# Patient Record
Sex: Female | Born: 1990 | Hispanic: No | Smoking: Never smoker
Health system: Southern US, Community
[De-identification: ages and names within clinical notes are randomized; demographics above are authoritative.]

## PROBLEM LIST (undated history)

## (undated) DIAGNOSIS — J45909 Unspecified asthma, uncomplicated: Secondary | ICD-10-CM

## (undated) HISTORY — PX: NO PAST SURGERIES: SHX2092

## (undated) HISTORY — DX: Unspecified asthma, uncomplicated: J45.909

---

## 2015-08-21 ENCOUNTER — Encounter: Payer: Self-pay | Admitting: Family Medicine

## 2015-08-21 ENCOUNTER — Ambulatory Visit (INDEPENDENT_AMBULATORY_CARE_PROVIDER_SITE_OTHER): Payer: BLUE CROSS/BLUE SHIELD | Admitting: Family Medicine

## 2015-08-21 VITALS — BP 122/78 | HR 79 | Temp 98.2°F | Ht 68.5 in | Wt 152.2 lb

## 2015-08-21 DIAGNOSIS — Z0000 Encounter for general adult medical examination without abnormal findings: Secondary | ICD-10-CM | POA: Diagnosis not present

## 2015-08-21 DIAGNOSIS — Z111 Encounter for screening for respiratory tuberculosis: Secondary | ICD-10-CM | POA: Diagnosis not present

## 2015-08-21 NOTE — Patient Instructions (Signed)
Your annual exam was normal.  Please follow up annually.  Return in 2 days for your TB test read.  Take care  Dr. Lacinda Axon  Health Maintenance, Female Adopting a healthy lifestyle and getting preventive care can go a long way to promote health and wellness. Talk with your health care provider about what schedule of regular examinations is right for you. This is a good chance for you to check in with your provider about disease prevention and staying healthy. In between checkups, there are plenty of things you can do on your own. Experts have done a lot of research about which lifestyle changes and preventive measures are most likely to keep you healthy. Ask your health care provider for more information. WEIGHT AND DIET  Eat a healthy diet  Be sure to include plenty of vegetables, fruits, low-fat dairy products, and lean protein.  Do not eat a lot of foods high in solid fats, added sugars, or salt.  Get regular exercise. This is one of the most important things you can do for your health.  Most adults should exercise for at least 150 minutes each week. The exercise should increase your heart rate and make you sweat (moderate-intensity exercise).  Most adults should also do strengthening exercises at least twice a week. This is in addition to the moderate-intensity exercise.  Maintain a healthy weight  Body mass index (BMI) is a measurement that can be used to identify possible weight problems. It estimates body fat based on height and weight. Your health care provider can help determine your BMI and help you achieve or maintain a healthy weight.  For females 23 years of age and older:   A BMI below 18.5 is considered underweight.  A BMI of 18.5 to 24.9 is normal.  A BMI of 25 to 29.9 is considered overweight.  A BMI of 30 and above is considered obese.  Watch levels of cholesterol and blood lipids  You should start having your blood tested for lipids and cholesterol at 25 years  of age, then have this test every 5 years.  You may need to have your cholesterol levels checked more often if:  Your lipid or cholesterol levels are high.  You are older than 25 years of age.  You are at high risk for heart disease.  CANCER SCREENING   Lung Cancer  Lung cancer screening is recommended for adults 33-19 years old who are at high risk for lung cancer because of a history of smoking.  A yearly low-dose CT scan of the lungs is recommended for people who:  Currently smoke.  Have quit within the past 15 years.  Have at least a 30-pack-year history of smoking. A pack year is smoking an average of one pack of cigarettes a day for 1 year.  Yearly screening should continue until it has been 15 years since you quit.  Yearly screening should stop if you develop a health problem that would prevent you from having lung cancer treatment.  Breast Cancer  Practice breast self-awareness. This means understanding how your breasts normally appear and feel.  It also means doing regular breast self-exams. Let your health care provider know about any changes, no matter how small.  If you are in your 20s or 30s, you should have a clinical breast exam (CBE) by a health care provider every 1-3 years as part of a regular health exam.  If you are 45 or older, have a CBE every year. Also consider having a  breast X-ray (mammogram) every year.  If you have a family history of breast cancer, talk to your health care provider about genetic screening.  If you are at high risk for breast cancer, talk to your health care provider about having an MRI and a mammogram every year.  Breast cancer gene (BRCA) assessment is recommended for women who have family members with BRCA-related cancers. BRCA-related cancers include:  Breast.  Ovarian.  Tubal.  Peritoneal cancers.  Results of the assessment will determine the need for genetic counseling and BRCA1 and BRCA2 testing. Cervical  Cancer Your health care provider may recommend that you be screened regularly for cancer of the pelvic organs (ovaries, uterus, and vagina). This screening involves a pelvic examination, including checking for microscopic changes to the surface of your cervix (Pap test). You may be encouraged to have this screening done every 3 years, beginning at age 63.  For women ages 28-65, health care providers may recommend pelvic exams and Pap testing every 3 years, or they may recommend the Pap and pelvic exam, combined with testing for human papilloma virus (HPV), every 5 years. Some types of HPV increase your risk of cervical cancer. Testing for HPV may also be done on women of any age with unclear Pap test results.  Other health care providers may not recommend any screening for nonpregnant women who are considered low risk for pelvic cancer and who do not have symptoms. Ask your health care provider if a screening pelvic exam is right for you.  If you have had past treatment for cervical cancer or a condition that could lead to cancer, you need Pap tests and screening for cancer for at least 20 years after your treatment. If Pap tests have been discontinued, your risk factors (such as having a new sexual partner) need to be reassessed to determine if screening should resume. Some women have medical problems that increase the chance of getting cervical cancer. In these cases, your health care provider may recommend more frequent screening and Pap tests. Colorectal Cancer  This type of cancer can be detected and often prevented.  Routine colorectal cancer screening usually begins at 25 years of age and continues through 25 years of age.  Your health care provider may recommend screening at an earlier age if you have risk factors for colon cancer.  Your health care provider may also recommend using home test kits to check for hidden blood in the stool.  A small camera at the end of a tube can be used to  examine your colon directly (sigmoidoscopy or colonoscopy). This is done to check for the earliest forms of colorectal cancer.  Routine screening usually begins at age 36.  Direct examination of the colon should be repeated every 5-10 years through 25 years of age. However, you may need to be screened more often if early forms of precancerous polyps or small growths are found. Skin Cancer  Check your skin from head to toe regularly.  Tell your health care provider about any new moles or changes in moles, especially if there is a change in a mole's shape or color.  Also tell your health care provider if you have a mole that is larger than the size of a pencil eraser.  Always use sunscreen. Apply sunscreen liberally and repeatedly throughout the day.  Protect yourself by wearing long sleeves, pants, a wide-brimmed hat, and sunglasses whenever you are outside. HEART DISEASE, DIABETES, AND HIGH BLOOD PRESSURE   High blood pressure causes  heart disease and increases the risk of stroke. High blood pressure is more likely to develop in:  People who have blood pressure in the high end of the normal range (130-139/85-89 mm Hg).  People who are overweight or obese.  People who are African American.  If you are 38-75 years of age, have your blood pressure checked every 3-5 years. If you are 30 years of age or older, have your blood pressure checked every year. You should have your blood pressure measured twice--once when you are at a hospital or clinic, and once when you are not at a hospital or clinic. Record the average of the two measurements. To check your blood pressure when you are not at a hospital or clinic, you can use:  An automated blood pressure machine at a pharmacy.  A home blood pressure monitor.  If you are between 39 years and 69 years old, ask your health care provider if you should take aspirin to prevent strokes.  Have regular diabetes screenings. This involves taking a  blood sample to check your fasting blood sugar level.  If you are at a normal weight and have a low risk for diabetes, have this test once every three years after 25 years of age.  If you are overweight and have a high risk for diabetes, consider being tested at a younger age or more often. PREVENTING INFECTION  Hepatitis B  If you have a higher risk for hepatitis B, you should be screened for this virus. You are considered at high risk for hepatitis B if:  You were born in a country where hepatitis B is common. Ask your health care provider which countries are considered high risk.  Your parents were born in a high-risk country, and you have not been immunized against hepatitis B (hepatitis B vaccine).  You have HIV or AIDS.  You use needles to inject street drugs.  You live with someone who has hepatitis B.  You have had sex with someone who has hepatitis B.  You get hemodialysis treatment.  You take certain medicines for conditions, including cancer, organ transplantation, and autoimmune conditions. Hepatitis C  Blood testing is recommended for:  Everyone born from [birth year] through 1965.  Anyone with known risk factors for hepatitis C. Sexually transmitted infections (STIs)  You should be screened for sexually transmitted infections (STIs) including gonorrhea and chlamydia if:  You are sexually active and are younger than 24 years of age.  You are older than 25 years of age and your health care provider tells you that you are at risk for this type of infection.  Your sexual activity has changed since you were last screened and you are at an increased risk for chlamydia or gonorrhea. Ask your health care provider if you are at risk.  If you do not have HIV, but are at risk, it may be recommended that you take a prescription medicine daily to prevent HIV infection. This is called pre-exposure prophylaxis (PrEP). You are considered at risk if:  You are sexually active and do  not regularly use condoms or know the HIV status of your partner(s).  You take drugs by injection.  You are sexually active with a partner who has HIV. Talk with your health care provider about whether you are at high risk of being infected with HIV. If you choose to begin PrEP, you should first be tested for HIV. You should then be tested every 3 months for as long as you are  taking PrEP.  PREGNANCY   If you are premenopausal and you may become pregnant, ask your health care provider about preconception counseling.  If you may become pregnant, take 400 to 800 micrograms (mcg) of folic acid every day.  If you want to prevent pregnancy, talk to your health care provider about birth control (contraception). OSTEOPOROSIS AND MENOPAUSE   Osteoporosis is a disease in which the bones lose minerals and strength with aging. This can result in serious bone fractures. Your risk for osteoporosis can be identified using a bone density scan.  If you are 4 years of age or older, or if you are at risk for osteoporosis and fractures, ask your health care provider if you should be screened.  Ask your health care provider whether you should take a calcium or vitamin D supplement to lower your risk for osteoporosis.  Menopause may have certain physical symptoms and risks.  Hormone replacement therapy may reduce some of these symptoms and risks. Talk to your health care provider about whether hormone replacement therapy is right for you.  HOME CARE INSTRUCTIONS   Schedule regular health, dental, and eye exams.  Stay current with your immunizations.   Do not use any tobacco products including cigarettes, chewing tobacco, or electronic cigarettes.  If you are pregnant, do not drink alcohol.  If you are breastfeeding, limit how much and how often you drink alcohol.  Limit alcohol intake to no more than 1 drink per day for nonpregnant women. One drink equals 12 ounces of beer, 5 ounces of wine, or 1  ounces of hard liquor.  Do not use street drugs.  Do not share needles.  Ask your health care provider for help if you need support or information about quitting drugs.  Tell your health care provider if you often feel depressed.  Tell your health care provider if you have ever been abused or do not feel safe at home.   This information is not intended to replace advice given to you by your health care provider. Make sure you discuss any questions you have with your health care provider.   Document Released: 09/29/2010 Document Revised: 04/06/2014 Document Reviewed: 02/15/2013 Elsevier Interactive Patient Education [employer].

## 2015-08-21 NOTE — Progress Notes (Signed)
Pre visit review using our clinic review tool, if applicable. No additional management support is needed unless otherwise documented below in the visit note. 

## 2015-08-22 ENCOUNTER — Encounter: Payer: Self-pay | Admitting: Family Medicine

## 2015-08-22 DIAGNOSIS — Z0000 Encounter for general adult medical examination without abnormal findings: Secondary | ICD-10-CM | POA: Insufficient documentation

## 2015-08-22 NOTE — Assessment & Plan Note (Signed)
Annual physical today. Declined pap smear at this time. Advised to return in the near future. Immunizations up to date. PPD today (for school).

## 2015-08-22 NOTE — Progress Notes (Signed)
Subjective:  Patient ID: Tracy CoffinJhonelle Schlesinger, female    DOB: May 08, 1990  Age: 25 y.o. MRN: [id number]  CC: Establish care  HPI Tracy Blake is a 25 y.o. female presents to the clinic today to establish care.  Preventative Healthcare  Pap smear: Last done in 2013. In need of pap smear.  Immunizations  Tetanus - 2014.  Pneumococcal - Not indicated.  Flu - Up to date.   Exercise: Exercises regularly.  Alcohol use: Yes. See below.  Smoking/tobacco use: No.  STD/HIV testing: Declines.  Regular dental exams: In need of dental exam.  Wears seat belt: Yes.   PMH, Surgical Hx, Family Hx, Social History reviewed and updated as below.  Past Medical History  Diagnosis Date  . Asthma    Past Surgical History  Procedure Laterality Date  . No past surgeries     Family History  Problem Relation Age of Onset  . Heart disease Maternal Grandmother   . Stroke Maternal Grandmother   . Hypertension Maternal Grandmother    Social History  Substance Use Topics  . Smoking status: Never Smoker   . Smokeless tobacco: Never Used  . Alcohol Use: 0.0 oz/week    0 Standard drinks or equivalent per week     Comment: Occasional; primarily weekends.   Review of Systems General: Denies unexplained weight loss, fever. Skin: Denies new or changing mole, sore/wound that won't heal. ENT: Trouble hearing, ringing in the ears, sores in the mouth, hoarseness, trouble swallowing. Eyes: Denies trouble seeing/visual disturbance. Heart/CV: Denies chest pain, shortness of breath, edema, palpitations. Lungs/Resp: Denies cough, shortness of breath, hemoptysis. Abd/GI: Denies nausea, vomiting, diarrhea, constipation, abdominal pain, hematochezia, melena. GU: Denies dysuria, incontinence, hematuria, urinary frequency, difficulty starting/keeping stream, vaginal discharge, sexual difficulty, lump in breasts. MSK: Denies joint pain/swelling, myalgias. Neuro: Denies headaches, weakness, numbness,  dizziness, syncope. Psych: Denies sadness, anxiety, stress, memory difficulty. Endocrine: Denies polyuria and polydipsia.  Objective:   Today's Vitals: BP 122/78 mmHg  Pulse 79  Temp(Src) 98.2 F (36.8 C) (Oral)  Ht 5' 8.5" (1.74 m)  Wt 152 lb 3.2 oz (69.037 kg)  BMI 22.80 kg/m2  SpO2 97%  Physical Exam  Constitutional: She is oriented to person, place, and time. She appears well-developed and well-nourished. No distress.  HENT:  Head: Normocephalic and atraumatic.  Nose: Nose normal.  Mouth/Throat: Oropharynx is clear and moist. No oropharyngeal exudate.  Normal TM's bilaterally.   Eyes: Conjunctivae are normal. No scleral icterus.  Neck: Neck supple. No thyromegaly present.  Cardiovascular: Normal rate and regular rhythm.   No murmur heard. Pulmonary/Chest: Effort normal and breath sounds normal. She has no wheezes. She has no rales.  Abdominal: Soft. She exhibits no distension. There is no tenderness. There is no rebound and no guarding.  Musculoskeletal: Normal range of motion. She exhibits no edema.  Lymphadenopathy:    She has no cervical adenopathy.  Neurological: She is alert and oriented to person, place, and time.  Skin: Skin is warm and dry. No rash noted.  Psychiatric: She has a normal mood and affect.  Vitals reviewed.  Assessment & Plan:   Problem List Items Addressed This Visit    Preventative health care    Annual physical today. Declined pap smear at this time. Advised to return in the near future. Immunizations up to date. PPD today (for school).        Other Visit Diagnoses    PPD screening test    -  Primary    Relevant Orders  PPD (Completed)      Follow-up: Later this year for Pap smear  Everlene Other DO [facility]

## 2015-08-23 ENCOUNTER — Ambulatory Visit (INDEPENDENT_AMBULATORY_CARE_PROVIDER_SITE_OTHER): Payer: BLUE CROSS/BLUE SHIELD

## 2015-08-23 DIAGNOSIS — Z111 Encounter for screening for respiratory tuberculosis: Secondary | ICD-10-CM | POA: Diagnosis not present

## 2015-08-23 LAB — TB SKIN TEST
INDURATION: 0 mm
TB SKIN TEST: NEGATIVE

## 2015-08-23 NOTE — Progress Notes (Signed)
Patient came in for PPD reading, gave results in a letter.

## 2015-09-05 NOTE — Progress Notes (Signed)
Care was provided under my supervision. I agree with the note.  Queen Abbett DO  

## 2015-09-11 ENCOUNTER — Ambulatory Visit (INDEPENDENT_AMBULATORY_CARE_PROVIDER_SITE_OTHER): Payer: BLUE CROSS/BLUE SHIELD | Admitting: Podiatry

## 2015-09-11 ENCOUNTER — Encounter: Payer: Self-pay | Admitting: Podiatry

## 2015-09-11 VITALS — BP 109/59 | HR 59 | Resp 16

## 2015-09-11 DIAGNOSIS — B353 Tinea pedis: Secondary | ICD-10-CM | POA: Diagnosis not present

## 2015-09-11 MED ORDER — NAFTIFINE HCL 1 % EX CREA: TOPICAL_CREAM | Freq: Every day | CUTANEOUS | Status: AC

## 2015-09-11 NOTE — Progress Notes (Signed)
   Subjective:    Patient ID: Tracy CoffinJhonelle Blake, female    DOB: 08/11/1990, 25 y.o.   MRN: [id number]  HPI: She presents today with a chief complaint of dry scaling skin for the past year or two. She states that it really does not itch but she has tried vinegar soaks and different remedies all to no avail. Was seen by her PCP but no treatment was arranged.    Review of Systems  All other systems reviewed and are negative.      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 Vital signs stable alert and oriented 3 pulses are palpable. Neurologic sensorium is intact the tendon reflexes are intact muscle strength is normal bilateral. Orthopedic evaluation was resolved with systems of ankle range of motion without crepitation with a rectus foot bilateral. Cutaneous evaluation and straight supple well-hydrated cutis with exception of some dry xerotic skin multiple small areas of clustered petechial type lesions consistent with tinea pedis. No toenail involvement        Assessment & Plan:  Tinea pedis right foot.  Plan: Naftin cream to be applied twice daily for the next 2 weeks follow up with me as needed.

## 2016-02-06 ENCOUNTER — Other Ambulatory Visit: Payer: Self-pay | Admitting: Obstetrics & Gynecology

## 2016-02-06 DIAGNOSIS — N6320 Unspecified lump in the left breast, unspecified quadrant: Secondary | ICD-10-CM

## 2016-02-18 ENCOUNTER — Ambulatory Visit: Payer: BLUE CROSS/BLUE SHIELD

## 2016-03-03 ENCOUNTER — Ambulatory Visit
Admission: RE | Admit: 2016-03-03 | Discharge: 2016-03-03 | Disposition: A | Discharge status: Home / Self Care | Payer: BLUE CROSS/BLUE SHIELD | Source: Ambulatory Visit | Attending: Obstetrics & Gynecology | Admitting: Obstetrics & Gynecology

## 2016-03-03 DIAGNOSIS — N6320 Unspecified lump in the left breast, unspecified quadrant: Secondary | ICD-10-CM

## 2016-07-02 ENCOUNTER — Ambulatory Visit (INDEPENDENT_AMBULATORY_CARE_PROVIDER_SITE_OTHER): Payer: BLUE CROSS/BLUE SHIELD | Admitting: Family Medicine

## 2016-07-02 ENCOUNTER — Encounter: Payer: Self-pay | Admitting: Family Medicine

## 2016-07-02 VITALS — BP 110/74 | HR 73 | Temp 98.3°F | Ht 68.5 in | Wt 153.2 lb

## 2016-07-02 DIAGNOSIS — Z0000 Encounter for general adult medical examination without abnormal findings: Secondary | ICD-10-CM

## 2016-07-02 LAB — COMPREHENSIVE METABOLIC PANEL
ALBUMIN: 4.1 g/dL (ref 3.5–5.2)
ALK PHOS: 38 U/L — AB (ref 39–117)
ALT: 8 U/L (ref 0–35)
AST: 16 U/L (ref 0–37)
BILIRUBIN TOTAL: 0.3 mg/dL (ref 0.2–1.2)
BUN: 9 mg/dL (ref 6–23)
CALCIUM: 9.2 mg/dL (ref 8.4–10.5)
CO2: 26 mEq/L (ref 19–32)
Chloride: 109 mEq/L (ref 96–112)
Creatinine, Ser: 0.78 mg/dL (ref 0.40–1.20)
GFR: 94.97 mL/min (ref >60.00)
Glucose, Bld: 95 mg/dL (ref 70–99)
Potassium: 4.4 mEq/L (ref 3.5–5.1)
Sodium: 141 mEq/L (ref 135–145)
TOTAL PROTEIN: 7 g/dL (ref 6.0–8.3)

## 2016-07-02 LAB — LIPID PANEL
CHOLESTEROL: 131 mg/dL (ref 0–200)
HDL: 44.8 mg/dL (ref >39.00)
LDL Cholesterol: 79 mg/dL (ref 0–99)
NonHDL: 86.05
TRIGLYCERIDES: 36 mg/dL (ref 0.0–149.0)
Total CHOL/HDL Ratio: 3
VLDL: 7.2 mg/dL (ref 0.0–40.0)

## 2016-07-02 LAB — CBC
HCT: 34 % — ABNORMAL LOW (ref 36.0–46.0)
HEMOGLOBIN: 11.3 g/dL — AB (ref 12.0–15.0)
MCHC: 33.3 g/dL (ref 30.0–36.0)
MCV: 90.1 fl (ref 78.0–100.0)
PLATELETS: 311 10*3/uL (ref 150.0–400.0)
RBC: 3.78 Mil/uL — ABNORMAL LOW (ref 3.87–5.11)
RDW: 12.8 % (ref 11.5–15.5)
WBC: 3.7 10*3/uL — AB (ref 4.0–10.5)

## 2016-07-02 LAB — TSH: TSH: 1.97 u[IU]/mL (ref 0.35–4.50)

## 2016-07-02 NOTE — Progress Notes (Signed)
Subjective:  Patient ID: Tracy Blake, female    DOB: 1991-03-20  Age: 26 y.o. MRN: [id number]  CC: Physical  HPI Elyanah Farino is a 26 y.o. female presents to the clinic today for an annual physical exam.  Preventative Healthcare  Pap smear: Up to date.   Immunizations - Up to date.  Labs: Desires labs today.  Alcohol use: See below.  Smoking/tobacco use: No.  STD/HIV testing: HIV testing today.  PMH, Surgical Hx, Family Hx, Social History reviewed and updated as below.  Past Medical History:  Diagnosis Date  . Asthma    Past Surgical History:  Procedure Laterality Date  . NO PAST SURGERIES     Family History  Problem Relation Age of Onset  . Heart disease Maternal Grandmother   . Stroke Maternal Grandmother   . Hypertension Maternal Grandmother    Social History  Substance Use Topics  . Smoking status: Never Smoker  . Smokeless tobacco: Never Used  . Alcohol use 0.0 oz/week     Comment: Occasional; primarily weekends.   Review of Systems General: Denies unexplained weight loss, fever. Skin: Denies new or changing mole, sore/wound that won't heal. ENT: Trouble hearing, ringing in the ears, sores in the mouth, hoarseness, trouble swallowing. Eyes: Denies trouble seeing/visual disturbance. Heart/CV: Denies chest pain, shortness of breath, edema, palpitations. Lungs/Resp: Denies cough, shortness of breath, hemoptysis. Abd/GI: Denies nausea, vomiting, diarrhea, constipation, abdominal pain, hematochezia, melena. GU: Denies dysuria, incontinence, hematuria, urinary frequency, difficulty starting/keeping stream, vaginal discharge, sexual difficulty, lump in breasts. MSK: Denies joint pain/swelling, myalgias. Neuro: Denies headaches, weakness, numbness, dizziness, syncope. Psych: Denies sadness, anxiety, stress, memory difficulty. Endocrine: Denies polyuria and polydipsia.  Objective:   Today's Vitals: BP 110/74   Pulse 73   Temp 98.3 F (36.8 C)  (Oral)   Ht 5' 8.5" (1.74 m)   Wt 153 lb 4 oz (69.5 kg)   SpO2 99%   BMI 22.96 kg/m   Physical Exam  Constitutional: She is oriented to person, place, and time. She appears well-developed and well-nourished. No distress.  HENT:  Head: Normocephalic and atraumatic.  Nose: Nose normal.  Mouth/Throat: Oropharynx is clear and moist. No oropharyngeal exudate.  Normal TM's bilaterally.   Eyes: Conjunctivae are normal. No scleral icterus.  Neck: Neck supple.  Cardiovascular: Normal rate and regular rhythm.   No murmur heard. Pulmonary/Chest: Effort normal and breath sounds normal. She has no wheezes. She has no rales.  Abdominal: Soft. She exhibits no distension. There is no tenderness. There is no rebound and no guarding.  Musculoskeletal: Normal range of motion. She exhibits no edema.  Lymphadenopathy:    She has no cervical adenopathy.  Neurological: She is alert and oriented to person, place, and time.  Skin: Skin is warm and dry. No rash noted.  Psychiatric: She has a normal mood and affect.  Vitals reviewed.  Assessment & Plan:   Problem List Items Addressed This Visit    Annual physical exam - Primary    Pap smear up to date.  Immunizations up-to-date. Labs today including HIV screening.      Relevant Orders   CBC   Comprehensive metabolic panel   Lipid panel   TSH   HIV antibody     Meds ordered this encounter  Medications  . etonogestrel-ethinyl estradiol (NUVARING) 0.12-0.015 MG/24HR vaginal ring    Sig: Place vaginally.   Follow-up: Return nurse visit for Monday 07/06/16, for PPD test.  Everlene Other DO [facility]

## 2016-07-02 NOTE — Assessment & Plan Note (Signed)
Pap smear up to date.  Immunizations up-to-date. Labs today including HIV screening.

## 2016-07-02 NOTE — Progress Notes (Signed)
Pre visit review using our clinic review tool, if applicable. No additional management support is needed unless otherwise documented below in the visit note. 

## 2016-07-02 NOTE — Patient Instructions (Signed)
We will call with your lab results.  Take care  Dr. Lacinda Axon   Health Maintenance, Female Adopting a healthy lifestyle and getting preventive care can go a long way to promote health and wellness. Talk with your health care provider about what schedule of regular examinations is right for you. This is a good chance for you to check in with your provider about disease prevention and staying healthy. In between checkups, there are plenty of things you can do on your own. Experts have done a lot of research about which lifestyle changes and preventive measures are most likely to keep you healthy. Ask your health care provider for more information. Weight and diet Eat a healthy diet  Be sure to include plenty of vegetables, fruits, low-fat dairy products, and lean protein.  Do not eat a lot of foods high in solid fats, added sugars, or salt.  Get regular exercise. This is one of the most important things you can do for your health.  Most adults should exercise for at least 150 minutes each week. The exercise should increase your heart rate and make you sweat (moderate-intensity exercise).  Most adults should also do strengthening exercises at least twice a week. This is in addition to the moderate-intensity exercise. Maintain a healthy weight  Body mass index (BMI) is a measurement that can be used to identify possible weight problems. It estimates body fat based on height and weight. Your health care provider can help determine your BMI and help you achieve or maintain a healthy weight.  For females 47 years of age and older:  A BMI below 18.5 is considered underweight.  A BMI of 18.5 to 24.9 is normal.  A BMI of 25 to 29.9 is considered overweight.  A BMI of 30 and above is considered obese. Watch levels of cholesterol and blood lipids  You should start having your blood tested for lipids and cholesterol at 26 years of age, then have this test every 5 years.  You may need to have your  cholesterol levels checked more often if:  Your lipid or cholesterol levels are high.  You are older than 26 years of age.  You are at high risk for heart disease. Cancer screening Lung Cancer  Lung cancer screening is recommended for adults 11-64 years old who are at high risk for lung cancer because of a history of smoking.  A yearly low-dose CT scan of the lungs is recommended for people who:  Currently smoke.  Have quit within the past 15 years.  Have at least a 30-pack-year history of smoking. A pack year is smoking an average of one pack of cigarettes a day for 1 year.  Yearly screening should continue until it has been 15 years since you quit.  Yearly screening should stop if you develop a health problem that would prevent you from having lung cancer treatment. Breast Cancer  Practice breast self-awareness. This means understanding how your breasts normally appear and feel.  It also means doing regular breast self-exams. Let your health care provider know about any changes, no matter how small.  If you are in your 20s or 30s, you should have a clinical breast exam (CBE) by a health care provider every 1-3 years as part of a regular health exam.  If you are 22 or older, have a CBE every year. Also consider having a breast X-ray (mammogram) every year.  If you have a family history of breast cancer, talk to your health care  provider about genetic screening.  If you are at high risk for breast cancer, talk to your health care provider about having an MRI and a mammogram every year.  Breast cancer gene (BRCA) assessment is recommended for women who have family members with BRCA-related cancers. BRCA-related cancers include:  Breast.  Ovarian.  Tubal.  Peritoneal cancers.  Results of the assessment will determine the need for genetic counseling and BRCA1 and BRCA2 testing. Cervical Cancer  Your health care provider may recommend that you be screened regularly for  cancer of the pelvic organs (ovaries, uterus, and vagina). This screening involves a pelvic examination, including checking for microscopic changes to the surface of your cervix (Pap test). You may be encouraged to have this screening done every 3 years, beginning at age 12.  For women ages 67-65, health care providers may recommend pelvic exams and Pap testing every 3 years, or they may recommend the Pap and pelvic exam, combined with testing for human papilloma virus (HPV), every 5 years. Some types of HPV increase your risk of cervical cancer. Testing for HPV may also be done on women of any age with unclear Pap test results.  Other health care providers may not recommend any screening for nonpregnant women who are considered low risk for pelvic cancer and who do not have symptoms. Ask your health care provider if a screening pelvic exam is right for you.  If you have had past treatment for cervical cancer or a condition that could lead to cancer, you need Pap tests and screening for cancer for at least 20 years after your treatment. If Pap tests have been discontinued, your risk factors (such as having a new sexual partner) need to be reassessed to determine if screening should resume. Some women have medical problems that increase the chance of getting cervical cancer. In these cases, your health care provider may recommend more frequent screening and Pap tests. Colorectal Cancer  This type of cancer can be detected and often prevented.  Routine colorectal cancer screening usually begins at 26 years of age and continues through 26 years of age.  Your health care provider may recommend screening at an earlier age if you have risk factors for colon cancer.  Your health care provider may also recommend using home test kits to check for hidden blood in the stool.  A small camera at the end of a tube can be used to examine your colon directly (sigmoidoscopy or colonoscopy). This is done to check for  the earliest forms of colorectal cancer.  Routine screening usually begins at age 31.  Direct examination of the colon should be repeated every 5-10 years through 26 years of age. However, you may need to be screened more often if early forms of precancerous polyps or small growths are found. Skin Cancer  Check your skin from head to toe regularly.  Tell your health care provider about any new moles or changes in moles, especially if there is a change in a mole's shape or color.  Also tell your health care provider if you have a mole that is larger than the size of a pencil eraser.  Always use sunscreen. Apply sunscreen liberally and repeatedly throughout the day.  Protect yourself by wearing long sleeves, pants, a wide-brimmed hat, and sunglasses whenever you are outside. Heart disease, diabetes, and high blood pressure  High blood pressure causes heart disease and increases the risk of stroke. High blood pressure is more likely to develop in:  People who  have blood pressure in the high end of the normal range (130-139/85-89 mm Hg).  People who are overweight or obese.  People who are African American.  If you are 68-18 years of age, have your blood pressure checked every 3-5 years. If you are 19 years of age or older, have your blood pressure checked every year. You should have your blood pressure measured twice-once when you are at a hospital or clinic, and once when you are not at a hospital or clinic. Record the average of the two measurements. To check your blood pressure when you are not at a hospital or clinic, you can use:  An automated blood pressure machine at a pharmacy.  A home blood pressure monitor.  If you are between 61 years and 35 years old, ask your health care provider if you should take aspirin to prevent strokes.  Have regular diabetes screenings. This involves taking a blood sample to check your fasting blood sugar level.  If you are at a normal weight and  have a low risk for diabetes, have this test once every three years after 26 years of age.  If you are overweight and have a high risk for diabetes, consider being tested at a younger age or more often. Preventing infection Hepatitis B  If you have a higher risk for hepatitis B, you should be screened for this virus. You are considered at high risk for hepatitis B if:  You were born in a country where hepatitis B is common. Ask your health care provider which countries are considered high risk.  Your parents were born in a high-risk country, and you have not been immunized against hepatitis B (hepatitis B vaccine).  You have HIV or AIDS.  You use needles to inject street drugs.  You live with someone who has hepatitis B.  You have had sex with someone who has hepatitis B.  You get hemodialysis treatment.  You take certain medicines for conditions, including cancer, organ transplantation, and autoimmune conditions. Hepatitis C  Blood testing is recommended for:  Everyone born from [birth year] through 1965.  Anyone with known risk factors for hepatitis C. Sexually transmitted infections (STIs)  You should be screened for sexually transmitted infections (STIs) including gonorrhea and chlamydia if:  You are sexually active and are younger than 26 years of age.  You are older than 26 years of age and your health care provider tells you that you are at risk for this type of infection.  Your sexual activity has changed since you were last screened and you are at an increased risk for chlamydia or gonorrhea. Ask your health care provider if you are at risk.  If you do not have HIV, but are at risk, it may be recommended that you take a prescription medicine daily to prevent HIV infection. This is called pre-exposure prophylaxis (PrEP). You are considered at risk if:  You are sexually active and do not regularly use condoms or know the HIV status of your partner(s).  You take drugs by  injection.  You are sexually active with a partner who has HIV. Talk with your health care provider about whether you are at high risk of being infected with HIV. If you choose to begin PrEP, you should first be tested for HIV. You should then be tested every 3 months for as long as you are taking PrEP. Pregnancy  If you are premenopausal and you may become pregnant, ask your health care provider about preconception counseling.  If you may become pregnant, take 400 to 800 micrograms (mcg) of folic acid every day.  If you want to prevent pregnancy, talk to your health care provider about birth control (contraception). Osteoporosis and menopause  Osteoporosis is a disease in which the bones lose minerals and strength with aging. This can result in serious bone fractures. Your risk for osteoporosis can be identified using a bone density scan.  If you are 37 years of age or older, or if you are at risk for osteoporosis and fractures, ask your health care provider if you should be screened.  Ask your health care provider whether you should take a calcium or vitamin D supplement to lower your risk for osteoporosis.  Menopause may have certain physical symptoms and risks.  Hormone replacement therapy may reduce some of these symptoms and risks. Talk to your health care provider about whether hormone replacement therapy is right for you. Follow these instructions at home:  Schedule regular health, dental, and eye exams.  Stay current with your immunizations.  Do not use any tobacco products including cigarettes, chewing tobacco, or electronic cigarettes.  If you are pregnant, do not drink alcohol.  If you are breastfeeding, limit how much and how often you drink alcohol.  Limit alcohol intake to no more than 1 drink per day for nonpregnant women. One drink equals 12 ounces of beer, 5 ounces of wine, or 1 ounces of hard liquor.  Do not use street drugs.  Do not share needles.  Ask  your health care provider for help if you need support or information about quitting drugs.  Tell your health care provider if you often feel depressed.  Tell your health care provider if you have ever been abused or do not feel safe at home. This information is not intended to replace advice given to you by your health care provider. Make sure you discuss any questions you have with your health care provider. Document Released: 09/29/2010 Document Revised: 08/22/2015 Document Reviewed: 12/18/2014 Elsevier Interactive Patient Education  2017 [employer].

## 2016-07-03 ENCOUNTER — Telehealth: Payer: Self-pay | Admitting: Radiology

## 2016-07-03 ENCOUNTER — Telehealth: Payer: Self-pay | Admitting: *Deleted

## 2016-07-03 ENCOUNTER — Other Ambulatory Visit: Payer: Self-pay | Admitting: Family Medicine

## 2016-07-03 DIAGNOSIS — D649 Anemia, unspecified: Secondary | ICD-10-CM

## 2016-07-03 LAB — HIV ANTIBODY (ROUTINE TESTING W REFLEX): HIV 1&2 Ab, 4th Generation: NONREACTIVE

## 2016-07-03 NOTE — Telephone Encounter (Signed)
Pt requested lab results  Pt contact [phone]

## 2016-07-03 NOTE — Telephone Encounter (Signed)
Pt coming in for labs on Monday, please place future orders. Thank you.  

## 2016-07-03 NOTE — Telephone Encounter (Signed)
Pt called and give labs.

## 2016-07-06 ENCOUNTER — Other Ambulatory Visit (INDEPENDENT_AMBULATORY_CARE_PROVIDER_SITE_OTHER): Payer: BLUE CROSS/BLUE SHIELD

## 2016-07-06 ENCOUNTER — Ambulatory Visit (INDEPENDENT_AMBULATORY_CARE_PROVIDER_SITE_OTHER): Payer: BLUE CROSS/BLUE SHIELD | Admitting: *Deleted

## 2016-07-06 DIAGNOSIS — Z111 Encounter for screening for respiratory tuberculosis: Secondary | ICD-10-CM

## 2016-07-06 DIAGNOSIS — D649 Anemia, unspecified: Secondary | ICD-10-CM

## 2016-07-06 LAB — CBC
HEMATOCRIT: 33.6 % — AB (ref 36.0–46.0)
Hemoglobin: 11.3 g/dL — ABNORMAL LOW (ref 12.0–15.0)
MCHC: 33.6 g/dL (ref 30.0–36.0)
MCV: 89.4 fl (ref 78.0–100.0)
Platelets: 296 10*3/uL (ref 150.0–400.0)
RBC: 3.76 Mil/uL — AB (ref 3.87–5.11)
RDW: 12.3 % (ref 11.5–15.5)
WBC: 5.3 10*3/uL (ref 4.0–10.5)

## 2016-07-06 LAB — VITAMIN B12: VITAMIN B 12: 191 pg/mL — AB (ref 211–911)

## 2016-07-06 LAB — FOLATE: FOLATE: 14.7 ng/mL (ref >5.9)

## 2016-07-07 ENCOUNTER — Other Ambulatory Visit: Payer: Self-pay | Admitting: Family Medicine

## 2016-07-07 LAB — IRON,TIBC AND FERRITIN PANEL
%SAT: 9 % — ABNORMAL LOW (ref 11–50)
Ferritin: 5 ng/mL — ABNORMAL LOW (ref 10–154)
IRON: 37 ug/dL — AB (ref 40–190)
TIBC: 428 ug/dL (ref 250–450)

## 2016-07-07 MED ORDER — FERROUS SULFATE 325 (65 FE) MG PO TBEC: 325.0000 mg | DELAYED_RELEASE_TABLET | Freq: Two times a day (BID) | ORAL | 3 refills | Status: AC

## 2016-07-08 ENCOUNTER — Other Ambulatory Visit (INDEPENDENT_AMBULATORY_CARE_PROVIDER_SITE_OTHER): Payer: BLUE CROSS/BLUE SHIELD

## 2016-07-08 ENCOUNTER — Telehealth: Payer: Self-pay | Admitting: Radiology

## 2016-07-08 ENCOUNTER — Ambulatory Visit (INDEPENDENT_AMBULATORY_CARE_PROVIDER_SITE_OTHER): Payer: BLUE CROSS/BLUE SHIELD

## 2016-07-08 ENCOUNTER — Ambulatory Visit: Payer: BLUE CROSS/BLUE SHIELD | Admitting: *Deleted

## 2016-07-08 ENCOUNTER — Other Ambulatory Visit: Payer: Self-pay | Admitting: Family Medicine

## 2016-07-08 DIAGNOSIS — R7611 Nonspecific reaction to tuberculin skin test without active tuberculosis: Secondary | ICD-10-CM

## 2016-07-08 DIAGNOSIS — Z111 Encounter for screening for respiratory tuberculosis: Secondary | ICD-10-CM

## 2016-07-08 LAB — TB SKIN TEST
INDURATION: 14 mm
TB SKIN TEST: POSITIVE

## 2016-07-08 NOTE — Telephone Encounter (Signed)
Per Olegario Messier pt is coming for labs and chest xray, please place future orders. Thank you.

## 2016-07-08 NOTE — Progress Notes (Signed)
Please place Chest x-ray and confirmed patient need the Quantiferon Gold for school.

## 2016-07-10 ENCOUNTER — Telehealth: Payer: Self-pay | Admitting: *Deleted

## 2016-07-10 LAB — QUANTIFERON TB GOLD ASSAY (BLOOD)
Interferon Gamma Release Assay: NEGATIVE
MITOGEN-NIL SO: 5.35 [IU]/mL
Quantiferon Nil Value: 0.04 IU/mL

## 2016-07-10 NOTE — Telephone Encounter (Signed)
Pt requested her lab results Pt contact [phone]

## 2016-07-10 NOTE — Telephone Encounter (Signed)
Tried calling pt unable to get a response. Pt had labs yesterday.

## 2018-10-17 IMAGING — DX DG CHEST 2V
2 series · 2 of 2 positions shown · non-contrast
Comparison: None.

CLINICAL DATA: Positive PPD

EXAM:
CHEST  2 VIEW

[chest pa]
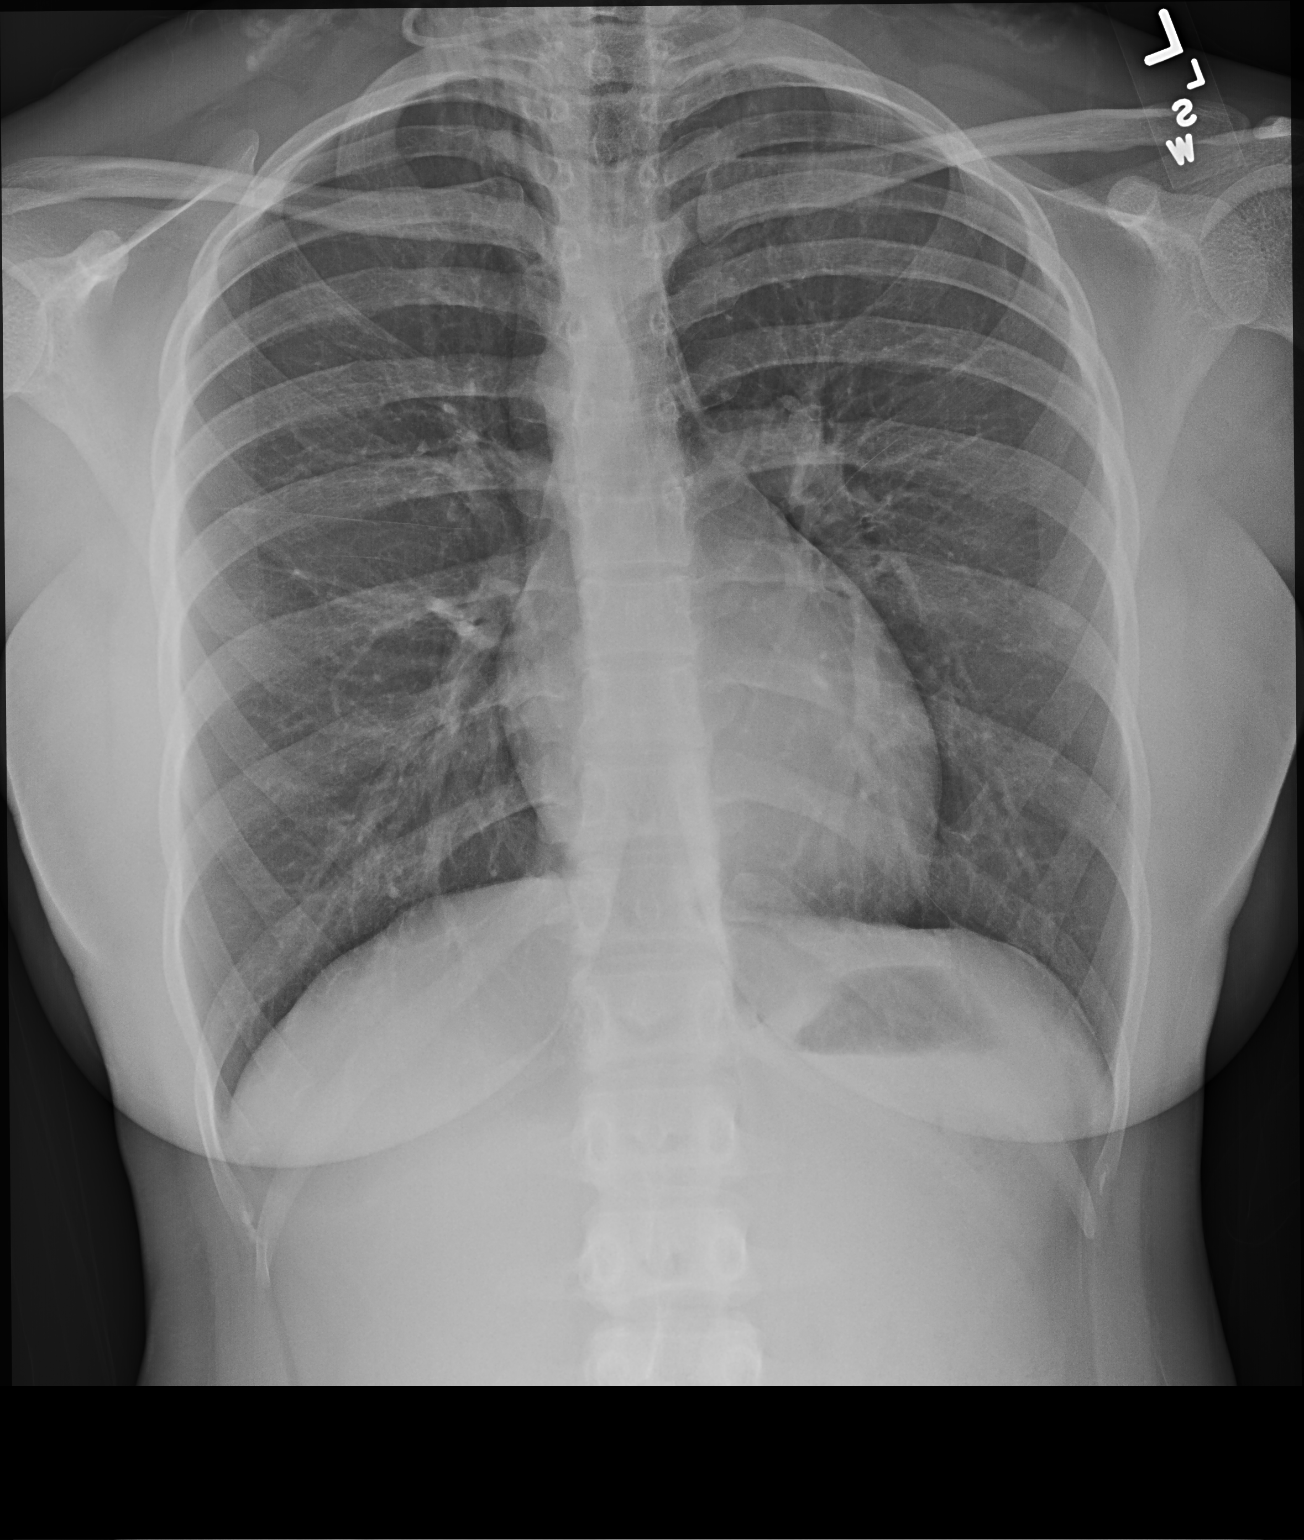

[chest lat]
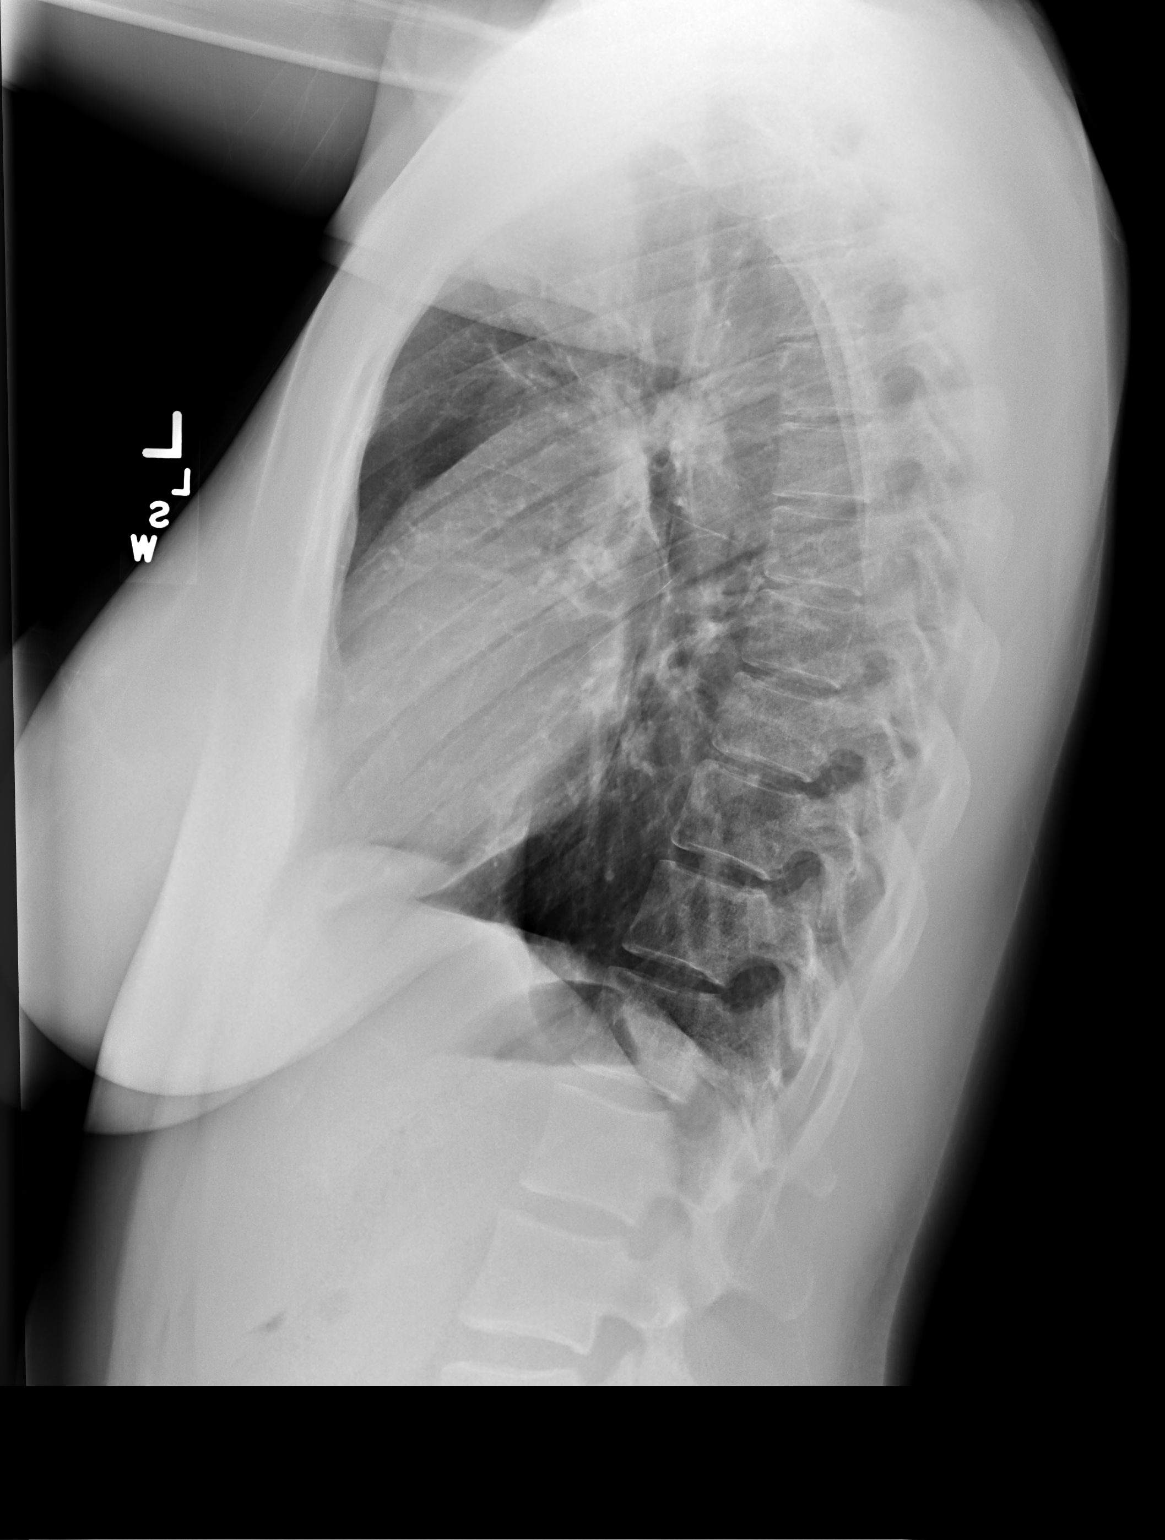

[2 of 2 positions shown; findings below may reference images not displayed]

FINDINGS: No active infiltrate or effusion is seen. No sequela of prior
tuberculous infection is noted. Mediastinal and hilar contours are
unremarkable. The heart is within normal limits in size. No bony
abnormality is noted.
IMPRESSION: No active cardiopulmonary disease.
